# Patient Record
Sex: Male | Born: 1980 | Race: White | Hispanic: No | Marital: Single | Smoking: Current some day smoker
Health system: Southern US, Community
[De-identification: ages and names within clinical notes are randomized; demographics above are authoritative.]

---

## 2013-10-05 ENCOUNTER — Ambulatory Visit (HOSPITAL_COMMUNITY)
Admission: RE | Admit: 2013-10-05 | Discharge: 2013-10-05 | Disposition: A | Discharge status: Home / Self Care | Payer: PRIVATE HEALTH INSURANCE | Source: Ambulatory Visit | Attending: Emergency Medicine | Admitting: Emergency Medicine

## 2013-10-05 ENCOUNTER — Other Ambulatory Visit: Payer: Self-pay | Admitting: Emergency Medicine

## 2013-10-05 ENCOUNTER — Ambulatory Visit (INDEPENDENT_AMBULATORY_CARE_PROVIDER_SITE_OTHER): Payer: PRIVATE HEALTH INSURANCE | Admitting: Emergency Medicine

## 2013-10-05 VITALS — BP 124/82 | HR 84 | Temp 98.0°F | Resp 16 | Ht 67.25 in | Wt 214.2 lb

## 2013-10-05 DIAGNOSIS — N509 Disorder of male genital organs, unspecified: Secondary | ICD-10-CM | POA: Diagnosis present

## 2013-10-05 DIAGNOSIS — N453 Epididymo-orchitis: Secondary | ICD-10-CM

## 2013-10-05 DIAGNOSIS — N50811 Right testicular pain: Secondary | ICD-10-CM

## 2013-10-05 DIAGNOSIS — N452 Orchitis: Secondary | ICD-10-CM

## 2013-10-05 DIAGNOSIS — R350 Frequency of micturition: Secondary | ICD-10-CM

## 2013-10-05 LAB — POCT URINALYSIS DIPSTICK
Bilirubin, UA: NEGATIVE
Glucose, UA: NEGATIVE
Ketones, UA: NEGATIVE
Leukocytes, UA: NEGATIVE
NITRITE UA: NEGATIVE
PH UA: 6
Protein, UA: 30
RBC UA: NEGATIVE
Spec Grav, UA: 1.015
UROBILINOGEN UA: 0.2

## 2013-10-05 LAB — HIV ANTIBODY (ROUTINE TESTING W REFLEX): HIV 1&2 Ab, 4th Generation: NONREACTIVE

## 2013-10-05 LAB — POCT CBC
GRANULOCYTE PERCENT: 70.5 % (ref 37–80)
HEMATOCRIT: 47.3 % (ref 43.5–53.7)
Hemoglobin: 15.4 g/dL (ref 14.1–18.1)
Lymph, poc: 1.6 (ref 0.6–3.4)
MCH, POC: 31.4 pg — AB (ref 27–31.2)
MCHC: 32.6 g/dL (ref 31.8–35.4)
MCV: 96.4 fL (ref 80–97)
MID (cbc): 0.5 (ref 0–0.9)
MPV: 8.3 fL (ref 0–99.8)
POC GRANULOCYTE: 4.9 (ref 2–6.9)
POC LYMPH PERCENT: 22.3 %L (ref 10–50)
POC MID %: 7.2 %M (ref 0–12)
Platelet Count, POC: 163 10*3/uL (ref 142–424)
RBC: 4.9 M/uL (ref 4.69–6.13)
RDW, POC: 12.1 %
WBC: 7 10*3/uL (ref 4.6–10.2)

## 2013-10-05 LAB — GLUCOSE, POCT (MANUAL RESULT ENTRY): POC GLUCOSE: 87 mg/dL (ref 70–99)

## 2013-10-05 LAB — RPR

## 2013-10-05 MED ORDER — DOXYCYCLINE HYCLATE 100 MG PO TABS: 100.0000 mg | ORAL_TABLET | Freq: Two times a day (BID) | ORAL | Status: DC

## 2013-10-05 NOTE — Addendum Note (Signed)
Addended by: Ihor Dow on: 10/05/2013 03:28 PM   Modules accepted: Orders

## 2013-10-05 NOTE — Patient Instructions (Signed)
Go to [facility] register at radiology for U/S

## 2013-10-05 NOTE — Progress Notes (Signed)
Subjective:    Patient ID: Dustin Andersen, male    DOB: [birth year], 33 y.o.   MRN: [id number]  This chart was scribed for Lesle Chris, MD by Jarvis Morgan, Medical Scribe. This patient was seen in Room 5 and the patient's care was started at 11:35 AM.  Chief Complaint  Patient presents with  . Testicle Pain    x2 day, swelling especially on the right side. Worsening   . Night Sweats    soaked sheets when he woke up this morning  . Nausea  . Fever    felt feverish last night      HPI HPI Comments: Dustin Andersen is a 33 y.o. male who presents to the [facility] complaining of constant, moderate, gradually worsening right testicular pain for 2 days. He states he woke up two days and it felt like he had been kicked in the area. He admits that when he got up to pee he noted that his right testicle was more tender. He states he immediately took an aspirin and hot bath to see if it would help with the pain. Pt initially thought the pain could be due to exercise. He reports that at about 3:00 yesterday the pain began to gradually increase. He noticed increase frequency, subjective fever, night sweats, swelling to his right testicle, and nausea.  He states that the pain is exacerbated by sitting. Pt has taken Ibuprofen and admits that has provided initial relief. He denies any swelling or tenderness to his left testicle. He also denies any dysuria or penile discharge.    Review of Systems  Constitutional: Positive for fever (subjective), chills and diaphoresis.  Gastrointestinal: Positive for nausea.  Genitourinary: Positive for frequency and testicular pain (right sided pain and swelling). Negative for dysuria and discharge.       Objective:   Physical Exam CONSTITUTIONAL: Well developed/well nourished HEAD: Normocephalic/atraumatic EYES: EOMI/PERRL ENMT: Mucous membranes moist NECK: supple no meningeal signs SPINE:entire spine nontender CV: S1/S2 noted, no  murmurs/rubs/gallops noted LUNGS: Lungs are clear to auscultation bilaterally, no apparent distress ABDOMEN: soft, nontender, no rebound or guarding GU:no cva tenderness Mild tenderness of right testicle and mild tenderness of epididymis of right testicle. Left testicle is normal. No discharge. NEURO: Pt is awake/alert, moves all extremitiesx4 EXTREMITIES: pulses normal, full ROM SKIN: warm, color normal PSYCH: no abnormalities of mood noted Results for orders placed in visit on 10/05/13  POCT CBC      Result Value Ref Range   WBC 7.0  4.6 - 10.2 K/uL   Lymph, poc 1.6  0.6 - 3.4   POC LYMPH PERCENT 22.3  10 - 50 %L   MID (cbc) 0.5  0 - 0.9   POC MID % 7.2  0 - 12 %M   POC Granulocyte 4.9  2 - 6.9   Granulocyte percent 70.5  37 - 80 %G   RBC 4.90  4.69 - 6.13 M/uL   Hemoglobin 15.4  14.1 - 18.1 g/dL   HCT, POC 40.9  81.1 - 53.7 %   MCV 96.4  80 - 97 fL   MCH, POC 31.4 (*) 27 - 31.2 pg   MCHC 32.6  31.8 - 35.4 g/dL   RDW, POC 91.4     Platelet Count, POC 163  142 - 424 K/uL   MPV 8.3  0 - 99.8 fL  GLUCOSE, POCT (MANUAL RESULT ENTRY)      Result Value Ref Range   POC Glucose 87  70 - 99 mg/dl       Assessment & Plan:  Patient going to [facility] for a Doppler of his testicle. Will treat patient with ibuprofen high dose along with doxycycline twice a day. Cultures were done .

## 2013-10-09 ENCOUNTER — Telehealth: Payer: Self-pay | Admitting: Emergency Medicine

## 2013-10-09 LAB — GC/CHLAMYDIA PROBE AMP
CT PROBE, AMP APTIMA: NEGATIVE
GC PROBE AMP APTIMA: NEGATIVE

## 2013-10-09 LAB — MUMPS ANTIBODY, IGG

## 2013-10-09 NOTE — Telephone Encounter (Signed)
Call check on status. Be sure patient is improving on medication

## 2013-10-09 NOTE — Telephone Encounter (Signed)
Lm for pt to rtn call to provide [place] with an update.

## 2013-10-10 LAB — MUMPS ANTIBODY, IGM: Mumps IgM Value: <1:20 {titer}

## 2015-07-09 IMAGING — US US ART/VEN ABD/PELV/SCROTUM DOPPLER LTD
1 series · 13 of 25 positions shown · non-contrast
Comparison: None.

CLINICAL DATA: Right-sided testicular pain. Evaluate for testicular
torsion.

EXAM:
ULTRASOUND OF SCROTUM
TECHNIQUE: Complete ultrasound examination of the testicles, epididymis, and
other scrotal structures was performed.

[Series 1: us art/ven abd/pelv/scrotum doppler ltd · 0.07mm/px · 13 of 79 slices shown]
[im 1/79]
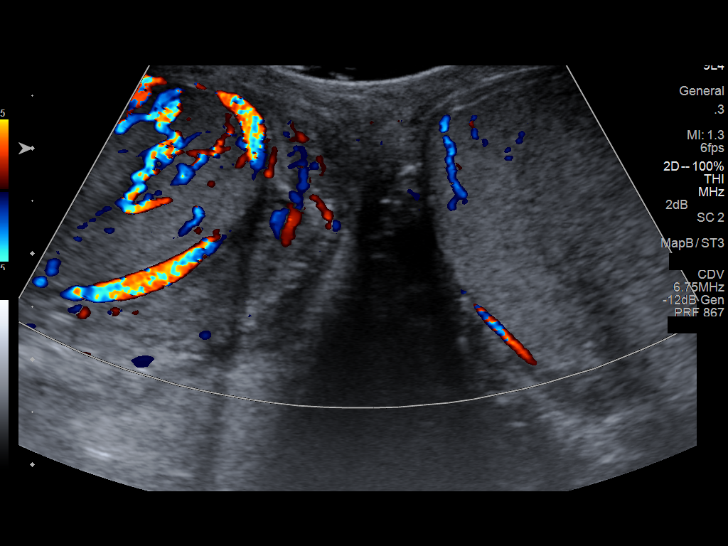
[im 7/79]
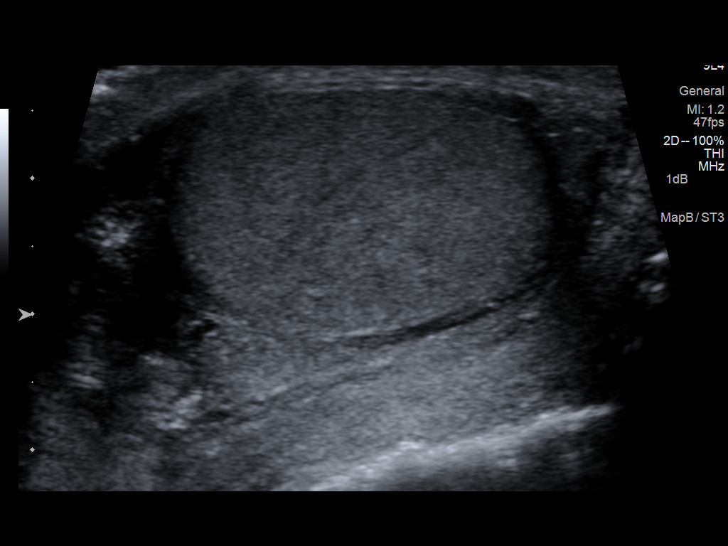
[im 14/79]
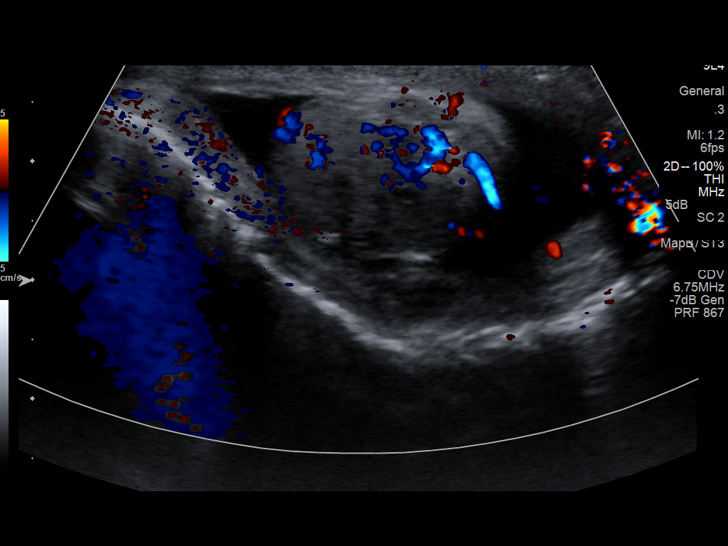
[im 20/79]
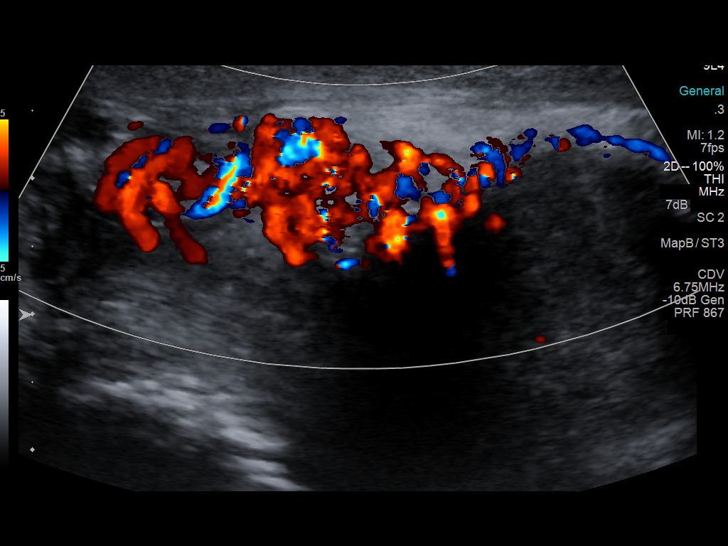
[im 27/79]
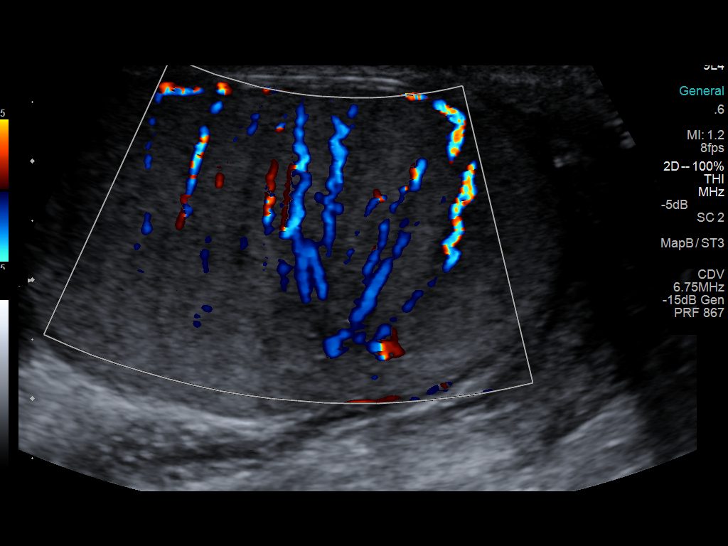
[im 33/79]
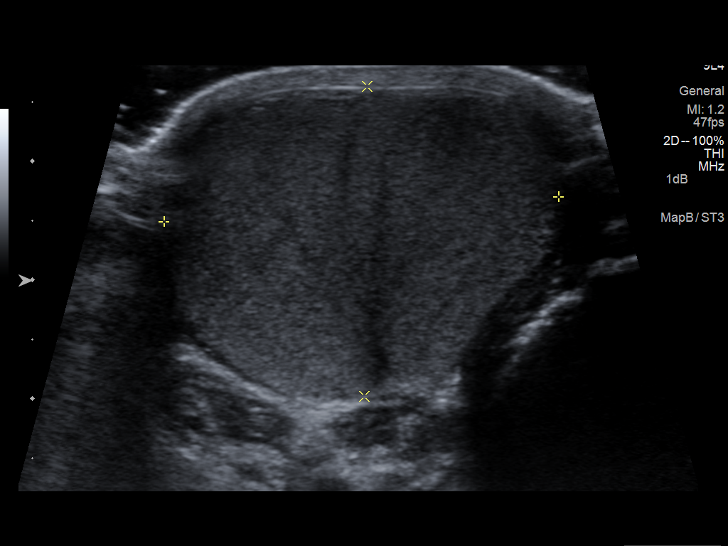
[im 40/79]
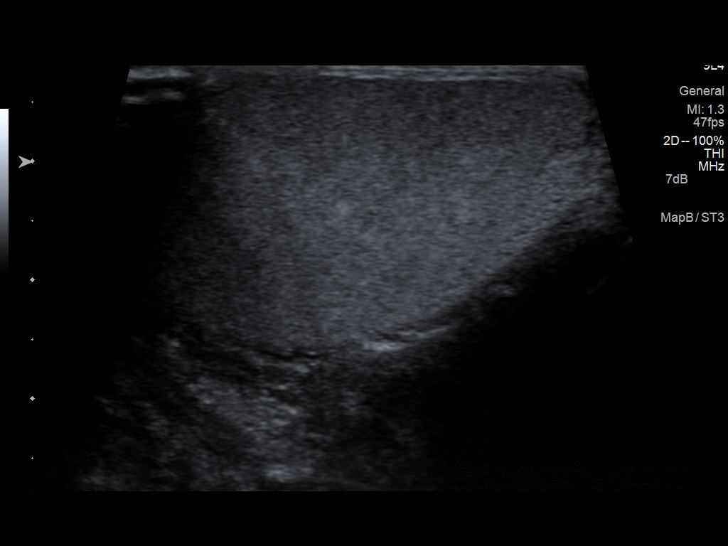
[im 46/79]
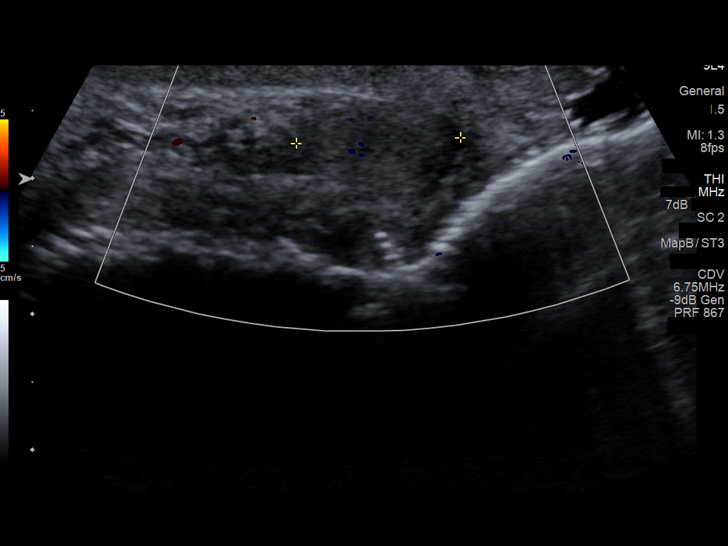
[im 53/79]
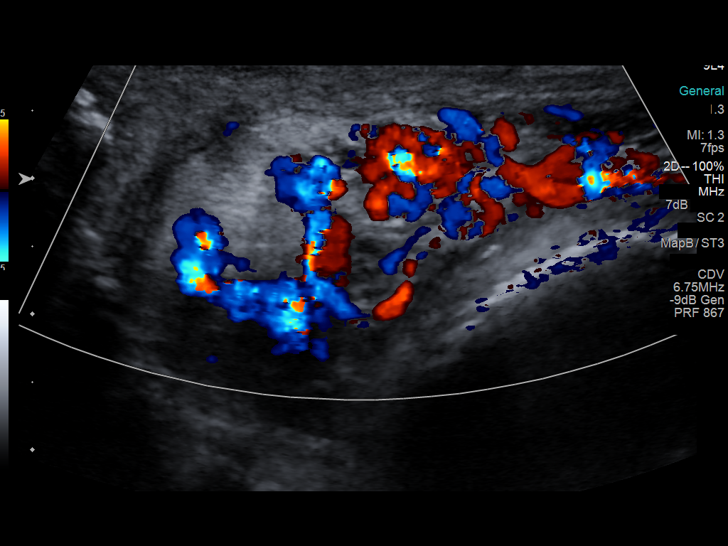
[im 59/79]
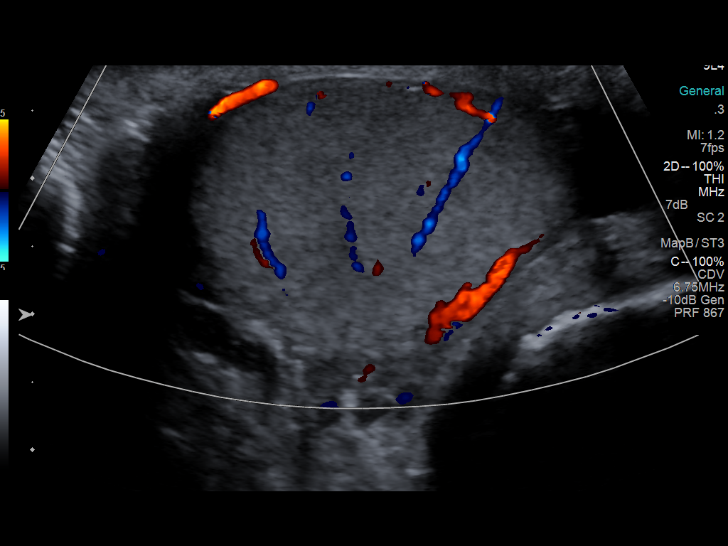
[im 66/79]
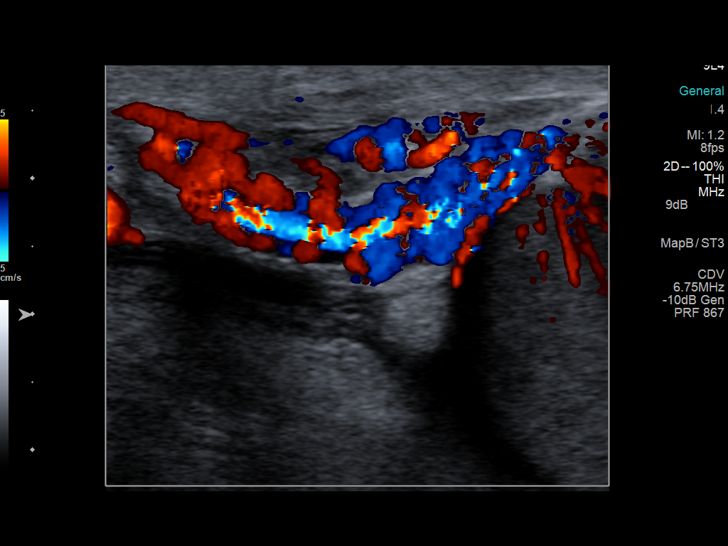
[im 72/79]
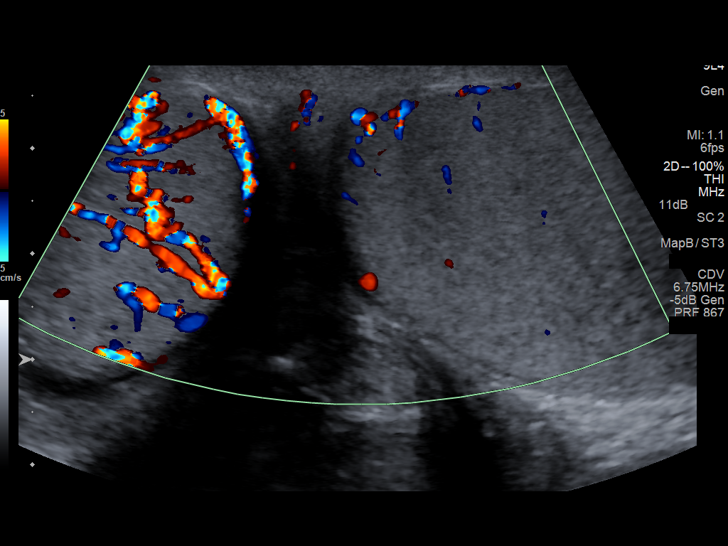
[im 79/79]
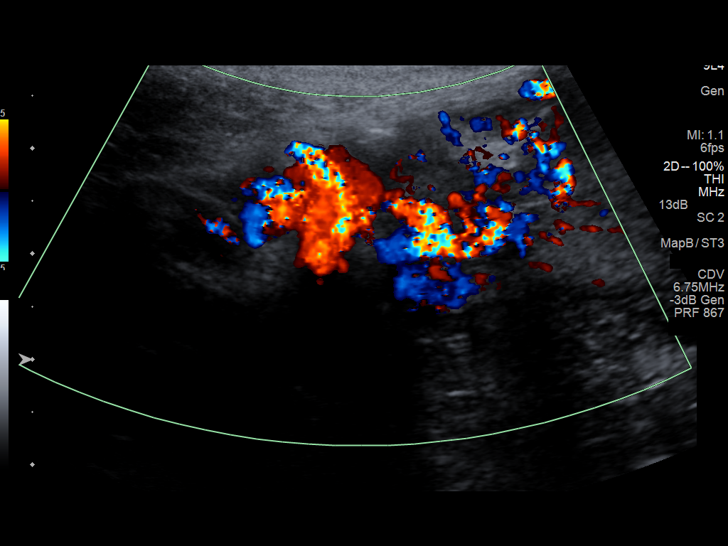

[13 of 25 positions shown; findings below may reference images not displayed]

FINDINGS: Right testicle

Measurements: Normal in size measuring 3.3 x 2.1 x 4.4 cm. There is
asymmetric hyperemia of the right testis (images 2 - 4). There is
very mild diffuse heterogeneity of the right testis parenchyma. No
discrete intra or extratesticular mass. Normal low resistance
arterial and venous waveforms are demonstrated within the right
testis.

Left testicle

Measurements: Normal in size measuring 3.3 x 2.6 x 4.7 cm. There is
relative homogeneity of the left testis parenchyma. No discrete
intra or S extratesticular mass. Normal low resistance arterial and
venous waveforms are demonstrated within the left testis.

Right epididymis:  Normal in size and appearance

Left epididymis:  Normal in size and appearance

Hydrocele:  Note is made of a small right-sided hydrocele.

Varicocele: Bilateral varicoceles are seen (right - image 22; left -
image 79).
IMPRESSION: 1. Asymmetric hyperemia of the right testis worrisome for orchitis.
No evidence of testicular torsion or intra or extratesticular mass.
2. Trace right-sided hydrocele, presumably reactive.
3. Small bilateral varicoceles

## 2021-09-10 DIAGNOSIS — I10 Essential (primary) hypertension: Secondary | ICD-10-CM | POA: Diagnosis not present

## 2021-09-10 DIAGNOSIS — Z13220 Encounter for screening for lipoid disorders: Secondary | ICD-10-CM | POA: Diagnosis not present

## 2021-09-10 DIAGNOSIS — Z130 Encounter for screening for diseases of the blood and blood-forming organs and certain disorders involving the immune mechanism: Secondary | ICD-10-CM | POA: Diagnosis not present

## 2023-04-24 ENCOUNTER — Ambulatory Visit: Payer: PRIVATE HEALTH INSURANCE | Admitting: Family Medicine

## 2023-04-24 ENCOUNTER — Encounter: Payer: Self-pay | Admitting: Family Medicine

## 2023-04-24 VITALS — BP 139/101 | HR 109 | Ht 67.0 in | Wt 237.0 lb

## 2023-04-24 DIAGNOSIS — F17200 Nicotine dependence, unspecified, uncomplicated: Secondary | ICD-10-CM | POA: Insufficient documentation

## 2023-04-24 DIAGNOSIS — Z114 Encounter for screening for human immunodeficiency virus [HIV]: Secondary | ICD-10-CM | POA: Diagnosis not present

## 2023-04-24 DIAGNOSIS — Z716 Tobacco abuse counseling: Secondary | ICD-10-CM | POA: Diagnosis not present

## 2023-04-24 DIAGNOSIS — E6609 Other obesity due to excess calories: Secondary | ICD-10-CM

## 2023-04-24 DIAGNOSIS — E669 Obesity, unspecified: Secondary | ICD-10-CM | POA: Diagnosis not present

## 2023-04-24 DIAGNOSIS — Z13220 Encounter for screening for lipoid disorders: Secondary | ICD-10-CM | POA: Diagnosis not present

## 2023-04-24 DIAGNOSIS — Z0000 Encounter for general adult medical examination without abnormal findings: Secondary | ICD-10-CM

## 2023-04-24 DIAGNOSIS — R21 Rash and other nonspecific skin eruption: Secondary | ICD-10-CM

## 2023-04-24 DIAGNOSIS — R944 Abnormal results of kidney function studies: Secondary | ICD-10-CM

## 2023-04-24 DIAGNOSIS — Z1159 Encounter for screening for other viral diseases: Secondary | ICD-10-CM

## 2023-04-24 DIAGNOSIS — Z0001 Encounter for general adult medical examination with abnormal findings: Secondary | ICD-10-CM

## 2023-04-24 DIAGNOSIS — E66812 Obesity, class 2: Secondary | ICD-10-CM

## 2023-04-24 DIAGNOSIS — Z130 Encounter for screening for diseases of the blood and blood-forming organs and certain disorders involving the immune mechanism: Secondary | ICD-10-CM | POA: Diagnosis not present

## 2023-04-24 DIAGNOSIS — Z13228 Encounter for screening for other metabolic disorders: Secondary | ICD-10-CM | POA: Diagnosis not present

## 2023-04-24 DIAGNOSIS — E66811 Obesity, class 1: Secondary | ICD-10-CM | POA: Insufficient documentation

## 2023-04-24 DIAGNOSIS — Z6837 Body mass index (BMI) 37.0-37.9, adult: Secondary | ICD-10-CM

## 2023-04-24 DIAGNOSIS — Z1329 Encounter for screening for other suspected endocrine disorder: Secondary | ICD-10-CM | POA: Diagnosis not present

## 2023-04-24 MED ORDER — DESONIDE 0.05 % EX CREA: TOPICAL_CREAM | Freq: Two times a day (BID) | CUTANEOUS | 0 refills | Status: DC

## 2023-04-24 NOTE — Progress Notes (Signed)
 New patient; Complete physical exam   Patient: Dustin Andersen   DOB: [birth year]   43 y.o. Male  MRN: [id number] Visit Date: 04/24/2023  Today's healthcare provider: Sherlyn Hay, DO   Chief Complaint  Patient presents with   Establish Care    No concerns   Subjective    Dustin Andersen is a 43 y.o. male who presents today for a complete physical exam.  He reports consuming a general diet.  He had not been exercising consistently but restarted about a week ago.  He generally feels well. He reports sleeping well. He does not have additional problems to discuss today.  HPI HPI     Establish Care    Additional comments: No concerns      Last edited by Thedora Hinders, CMA on 04/24/2023  8:58 AM.      Dustin Andersen is a 43 year old male who presents for a routine physical exam.  He has not had a formal check-up since 2011 and feels nervous about the visit but recognizes the importance of a health evaluation.  He has a history of smoking, currently averaging about five cigarettes a day, primarily at work. He has reduced his smoking from a previous habit of a pack a day during college. He uses stop smoking lozenges and Tic Tacs to help reduce smoking. Smoking is more of a work habit, and he smokes less on weekends due to family activities.  He has recently resumed physical activity, having stopped exercising regularly since his daughter's birth. He started going back to the gym last Sunday and can currently run half a mile before needing to walk. He aims to increase his physical activity gradually. He feels sore from working out but attributes it to exercise.  He is concerned about his weight, noting that he has gained weight since he stopped working out regularly. His diet includes eating more than normal, sometimes waking up to eat, and consuming burgers for breakfast. He wants to make healthier choices, influenced by his seven-year-old daughter's nutritionist's  recommendations. No increased thirst or urination.  He uses hydrocortisone cream for a rash that developed after wearing old boxers with worn-out elastic. The rash is located on his left lateral waistline, and the cream has been helping. He also uses the cream for occasional itching in other areas.  His stepfather is currently battling leukemia, which has been a significant stressor for him. His stepfather is undergoing treatment at [id number] and is part of a clinical trial. He has been involved in supporting his stepfather, including watching his dog during treatments.   History reviewed. No pertinent past medical history. History reviewed. No pertinent surgical history. Social History   Socioeconomic History   Marital status: Single    Spouse name: Not on file   Number of children: Not on file   Years of education: Not on file   Highest education level: Not on file  Occupational History   Not on file  Tobacco Use   Smoking status: Some Days   Smokeless tobacco: Not on file   Tobacco comments:    Only smokes about 1/4 pack a day  Substance and Sexual Activity   Alcohol use: Yes   Drug use: No   Sexual activity: Yes  Other Topics Concern   Not on file  Social History Narrative   Not on file   Social Drivers of Health   Financial Resource Strain: Low Risk  (04/24/2023)   Overall Financial Resource  Strain (CARDIA)    Difficulty of Paying Living Expenses: Not hard at all  Food Insecurity: No Food Insecurity (04/24/2023)   Hunger Vital Sign    Worried About Running Out of Food in the Last Year: Never true    Ran Out of Food in the Last Year: Never true  Transportation Needs: No Transportation Needs (04/24/2023)   PRAPARE - Administrator, Civil Service (Medical): No    Lack of Transportation (Non-Medical): No  Physical Activity: Not on file  Stress: No Stress Concern Present (04/24/2023)   [employer] of Occupational Health - Occupational Stress Questionnaire     Feeling of Stress : Not at all  Social Connections: Not on file  Intimate Partner Violence: Not At Risk (04/24/2023)   Humiliation, Afraid, Rape, and Kick questionnaire    Fear of Current or Ex-Partner: No    Emotionally Abused: No    Physically Abused: No    Sexually Abused: No   Family Status  Relation Name Status   Mother  Alive   Father  Alive   Mat Aunt  (Not Specified)   PGM  (Not Specified)  No partnership data on file   Family History  Problem Relation Age of Onset   Cancer Mother        Breast CA   Aneurysm Maternal Aunt    Cancer Paternal Grandmother        Breast CA   No Known Allergies  Patient Care Team: Travonna Swindle N, DO as PCP - General (Family Medicine)   Medications: Outpatient Medications Prior to Visit  Medication Sig   doxycycline (VIBRA-TABS) 100 MG tablet Take 1 tablet (100 mg total) by mouth 2 (two) times daily. (Patient not taking: Reported on 04/24/2023)   No facility-administered medications prior to visit.    Review of Systems  Constitutional:  Negative for appetite change, chills, fatigue and fever.  HENT:  Negative for congestion, ear pain, hearing loss, nosebleeds and trouble swallowing.   Eyes:  Negative for pain and visual disturbance.  Respiratory:  Negative for cough, chest tightness and shortness of breath.   Cardiovascular:  Negative for chest pain, palpitations and leg swelling.  Gastrointestinal:  Negative for abdominal pain, blood in stool, constipation, diarrhea, nausea and vomiting.  Endocrine: Negative for polydipsia, polyphagia and polyuria.  Genitourinary:  Negative for dysuria and flank pain.  Musculoskeletal:  Negative for arthralgias, back pain, joint swelling, myalgias and neck stiffness.  Skin:  Negative for color change, rash and wound.  Neurological:  Negative for dizziness, tremors, seizures, speech difficulty, weakness, light-headedness and headaches.  Psychiatric/Behavioral:  Negative for behavioral problems,  confusion, decreased concentration, dysphoric mood and sleep disturbance. The patient is not nervous/anxious.   All other systems reviewed and are negative.     Objective    BP (!) 139/101   Pulse (!) 109   Ht 5\' 7"  (1.702 m)   Wt 237 lb (107.5 kg)   SpO2 98%   BMI 37.12 kg/m    Physical Exam Vitals and nursing note reviewed.  Constitutional:      General: He is awake.     Appearance: Normal appearance.  HENT:     Head: Normocephalic and atraumatic.     Right Ear: Tympanic membrane, ear canal and external ear normal.     Left Ear: Tympanic membrane, ear canal and external ear normal.     Nose: Nose normal.     Mouth/Throat:     Mouth: Mucous membranes are moist.  Pharynx: Oropharynx is clear. No oropharyngeal exudate or posterior oropharyngeal erythema.  Eyes:     General: No scleral icterus.    Extraocular Movements: Extraocular movements intact.     Conjunctiva/sclera: Conjunctivae normal.     Pupils: Pupils are equal, round, and reactive to light.  Neck:     Thyroid: No thyromegaly or thyroid tenderness.  Cardiovascular:     Rate and Rhythm: Normal rate and regular rhythm.     Pulses: Normal pulses.     Heart sounds: Normal heart sounds.  Pulmonary:     Effort: Pulmonary effort is normal. No tachypnea, bradypnea or respiratory distress.     Breath sounds: Normal breath sounds. No stridor. No wheezing, rhonchi or rales.  Abdominal:     General: Bowel sounds are normal. There is no distension.     Palpations: Abdomen is soft. There is no mass.     Tenderness: There is no abdominal tenderness. There is no guarding.     Hernia: No hernia is present.  Musculoskeletal:     Cervical back: Normal range of motion and neck supple.     Right lower leg: No edema.     Left lower leg: No edema.  Lymphadenopathy:     Cervical: No cervical adenopathy.  Skin:    General: Skin is warm and dry.     Findings: Rash present.       Neurological:     Mental Status: He is  alert and oriented to person, place, and time. Mental status is at baseline.  Psychiatric:        Mood and Affect: Mood normal.        Behavior: Behavior normal.      Last depression screening scores    04/24/2023    9:02 AM  PHQ 2/9 Scores  PHQ - 2 Score 0   Last fall risk screening    04/24/2023    9:15 AM  Fall Risk   Falls in the past year? 0  Number falls in past yr: 0  Injury with Fall? 0  Risk for fall due to : No Fall Risks   Last Audit-C alcohol use screening    04/24/2023    9:01 AM  Alcohol Use Disorder Test (AUDIT)  1. How often do you have a drink containing alcohol? 1  2. How many drinks containing alcohol do you have on a typical day when you are drinking? 0  3. How often do you have six or more drinks on one occasion? 0  AUDIT-C Score 1   A score of 3 or more in women, and 4 or more in men indicates increased risk for alcohol abuse, EXCEPT if all of the points are from question 1   No results found for any visits on 04/24/23.  Assessment & Plan    Routine Health Maintenance and Physical Exam  Exercise Activities and Dietary recommendations  Goals   None     Immunization History  Administered Date(s) Administered   Janssen (J&J) SARS-COV-2 Vaccination 04/05/2019    Health Maintenance  Topic Date Due   Hepatitis C Screening  Never done   INFLUENZA VACCINE  05/01/2023 (Originally 09/01/2022)   COVID-19 Vaccine (2 - 2024-25 season) 05/10/2023 (Originally 10/02/2022)   DTaP/Tdap/Td (1 - Tdap) 05/28/2023 (Originally 10/08/1999)   Pneumococcal Vaccine 85-48 Years old (1 of 2 - PCV) 04/23/2024 (Originally 10/08/1986)   HIV Screening  Completed   HPV VACCINES  Aged Out    Discussed health benefits of physical  activity, and encouraged him to engage in regular exercise appropriate for his age and condition.   Encounter for medical examination to establish care Assessment & Plan: Physical exam overall unremarkable except as noted above. Routine lab work  ordered as noted. Does not want Tdap vaccine today but will consider at next visit; he is afraid of shots and wants to prepare mentally for it. No physical exam since 2011. Lacks screening for HIV, hepatitis C, and vaccinations. Agreed to fasting blood work for cholesterol and diabetes screening. - Screen for HIV and hepatitis C. - Order fasting blood work for cholesterol and diabetes screening. - Discuss vaccinations, including tetanus, COVID booster, flu shot, and pneumonia vaccine, in future visits.    Nicotine dependence with current use Assessment & Plan: Addressed as noted below.    Encounter for smoking cessation counseling Assessment & Plan: Has been cutting back using lozenges. Smoking about 5 cigarettes per day at work. - Encourage reduction to two cigarettes per day. - Suggest non-vape alternatives to occupy hands and mouth. - Continue using stop smoking lozenges.   Decreased glomerular filtration rate (GFR) Assessment & Plan: eGFR 78 09/10/2021 (in Care Everywhere). Recheck today.   Screening for endocrine, metabolic and immunity disorder -     Comprehensive metabolic panel -     Hemoglobin A1c  Screening for lipid disorders Assessment & Plan: Checking cholesterol today. Patient has started making changes to diet and exercise. Will plan to recheck in 6 months if elevated today.  Orders: -     Lipid panel  Encounter for hepatitis C screening test for low risk patient -     HCV Ab w Reflex to Quant PCR  Encounter for screening for HIV -     HIV Antibody (routine testing w rflx)  Obesity (BMI 35.0-39.9 without comorbidity) -     Hemoglobin A1c  Rash -     Desonide; Apply topically 2 (two) times daily.  Dispense: 30 g; Refill: 0  Class 2 obesity due to excess calories without serious comorbidity with body mass index (BMI) of 37.0 to 37.9 in adult Assessment & Plan: Weight gain due to lack of exercise. Resumed running and walking. Improving diet by reducing  portions and choosing healthier options. - Encourage continuation of exercise routine with gradual increase in activity. - Advise on dietary modifications, including reducing portion sizes and choosing healthier food options.   Rash Groin rash likely from irritation. Hydrocortisone provides some relief. Prescribed stronger topical cream. - Prescribe a stronger topical cream for the rash. - Advise to use the cream sparingly and continue with hydrocortisone if needed. - Suggest trying a lotion type cream if itching occurs.   Return in about 6 months (around 10/25/2023) for Weight.     I discussed the assessment and treatment plan with the patient  The patient was provided an opportunity to ask questions and all were answered. The patient agreed with the plan and demonstrated an understanding of the instructions.   The patient was advised to call back or seek an in-person evaluation if the symptoms worsen or if the condition fails to improve as anticipated.    Sherlyn Hay, DO  [facility] [phone] (phone) [phone] (fax)  [facility]

## 2023-04-24 NOTE — Assessment & Plan Note (Signed)
 Weight gain due to lack of exercise. Resumed running and walking. Improving diet by reducing portions and choosing healthier options. - Encourage continuation of exercise routine with gradual increase in activity. - Advise on dietary modifications, including reducing portion sizes and choosing healthier food options.

## 2023-04-24 NOTE — Assessment & Plan Note (Addendum)
 Has been cutting back using lozenges. Smoking about 5 cigarettes per day at work. - Encourage reduction to two cigarettes per day. - Suggest non-vape alternatives to occupy hands and mouth. - Continue using stop smoking lozenges.

## 2023-04-24 NOTE — Assessment & Plan Note (Addendum)
 eGFR 78 09/10/2021 (in Care Everywhere). Recheck today.

## 2023-04-24 NOTE — Assessment & Plan Note (Signed)
 Checking cholesterol today. Patient has started making changes to diet and exercise. Will plan to recheck in 6 months if elevated today.

## 2023-04-24 NOTE — Assessment & Plan Note (Signed)
 Addressed as noted below.

## 2023-04-24 NOTE — Assessment & Plan Note (Addendum)
 Physical exam overall unremarkable except as noted above. Routine lab work ordered as noted. Does not want Tdap vaccine today but will consider at next visit; he is afraid of shots and wants to prepare mentally for it. No physical exam since 2011. Lacks screening for HIV, hepatitis C, and vaccinations. Agreed to fasting blood work for cholesterol and diabetes screening. - Screen for HIV and hepatitis C. - Order fasting blood work for cholesterol and diabetes screening. - Discuss vaccinations, including tetanus, COVID booster, flu shot, and pneumonia vaccine, in future visits.

## 2023-04-25 LAB — COMPREHENSIVE METABOLIC PANEL
ALT: 31 IU/L (ref 0–44)
AST: 22 IU/L (ref 0–40)
Albumin: 4.4 g/dL (ref 4.1–5.1)
Alkaline Phosphatase: 48 IU/L (ref 44–121)
BUN/Creatinine Ratio: 22 — ABNORMAL HIGH (ref 9–20)
BUN: 20 mg/dL (ref 6–24)
Bilirubin Total: <0.2 mg/dL (ref 0.0–1.2)
CO2: 21 mmol/L (ref 20–29)
Calcium: 9.8 mg/dL (ref 8.7–10.2)
Chloride: 100 mmol/L (ref 96–106)
Creatinine, Ser: 0.93 mg/dL (ref 0.76–1.27)
Globulin, Total: 2.2 g/dL (ref 1.5–4.5)
Glucose: 201 mg/dL — ABNORMAL HIGH (ref 70–99)
Potassium: 4.5 mmol/L (ref 3.5–5.2)
Sodium: 137 mmol/L (ref 134–144)
Total Protein: 6.6 g/dL (ref 6.0–8.5)
eGFR: 105 mL/min/{1.73_m2} (ref >59)

## 2023-04-25 LAB — LIPID PANEL
Chol/HDL Ratio: 4.1 ratio (ref 0.0–5.0)
Cholesterol, Total: 195 mg/dL (ref 100–199)
HDL: 47 mg/dL (ref >39)
LDL Chol Calc (NIH): 104 mg/dL — ABNORMAL HIGH (ref 0–99)
Triglycerides: 255 mg/dL — ABNORMAL HIGH (ref 0–149)
VLDL Cholesterol Cal: 44 mg/dL — ABNORMAL HIGH (ref 5–40)

## 2023-04-25 LAB — HEMOGLOBIN A1C
Est. average glucose Bld gHb Est-mCnc: 177 mg/dL
Hgb A1c MFr Bld: 7.8 % — ABNORMAL HIGH (ref 4.8–5.6)

## 2023-04-25 LAB — HCV AB W REFLEX TO QUANT PCR: HCV Ab: NONREACTIVE

## 2023-04-25 LAB — HIV ANTIBODY (ROUTINE TESTING W REFLEX): HIV Screen 4th Generation wRfx: NONREACTIVE

## 2023-04-25 LAB — HCV INTERPRETATION

## 2023-05-08 ENCOUNTER — Other Ambulatory Visit: Payer: Self-pay | Admitting: Family Medicine

## 2023-05-08 ENCOUNTER — Encounter: Payer: Self-pay | Admitting: Family Medicine

## 2023-05-08 ENCOUNTER — Telehealth: Payer: Self-pay

## 2023-05-08 DIAGNOSIS — E119 Type 2 diabetes mellitus without complications: Secondary | ICD-10-CM

## 2023-05-08 DIAGNOSIS — R7309 Other abnormal glucose: Secondary | ICD-10-CM

## 2023-05-08 MED ORDER — BLOOD GLUCOSE TEST VI STRP: 1.0000 | ORAL_STRIP | Freq: Every day | 3 refills | Status: AC

## 2023-05-08 MED ORDER — BLOOD GLUCOSE MONITORING SUPPL DEVI: 1.0000 | Freq: Every day | 0 refills | Status: AC

## 2023-05-08 MED ORDER — LANCET DEVICE MISC: 1.0000 | Freq: Every day | 0 refills | Status: AC

## 2023-05-08 MED ORDER — LANCETS MISC. MISC: 1.0000 | Freq: Every day | 3 refills | Status: AC

## 2023-05-08 NOTE — Telephone Encounter (Signed)
 Copied from CRM [id number]. Topic: Clinical - Lab/Test Results >> May 08, 2023  8:38 AM Alessandra Bevels wrote: Reason for CRM: Patient is calling to report that he did not eat right prior to lab work. He ate 5 burgers that week with chocolate milk. Patient has returned to the gym. And patient would like to work on his diet and exercise if it is ok with Dr. Payton Mccallum. And return back in September for fasting labs. Please advise

## 2023-05-11 ENCOUNTER — Encounter: Payer: Self-pay | Admitting: Family Medicine

## 2023-10-23 ENCOUNTER — Encounter: Payer: Self-pay | Admitting: Family Medicine

## 2023-10-23 ENCOUNTER — Ambulatory Visit: Admitting: Family Medicine

## 2023-10-23 VITALS — BP 142/100 | HR 108 | Temp 98.7°F | Ht 67.0 in | Wt 223.2 lb

## 2023-10-23 DIAGNOSIS — E7800 Pure hypercholesterolemia, unspecified: Secondary | ICD-10-CM

## 2023-10-23 DIAGNOSIS — R7309 Other abnormal glucose: Secondary | ICD-10-CM | POA: Diagnosis not present

## 2023-10-23 DIAGNOSIS — Z7689 Persons encountering health services in other specified circumstances: Secondary | ICD-10-CM

## 2023-10-23 DIAGNOSIS — E66811 Obesity, class 1: Secondary | ICD-10-CM | POA: Diagnosis not present

## 2023-10-23 DIAGNOSIS — K529 Noninfective gastroenteritis and colitis, unspecified: Secondary | ICD-10-CM

## 2023-10-23 DIAGNOSIS — R030 Elevated blood-pressure reading, without diagnosis of hypertension: Secondary | ICD-10-CM

## 2023-10-23 DIAGNOSIS — R21 Rash and other nonspecific skin eruption: Secondary | ICD-10-CM

## 2023-10-23 DIAGNOSIS — F17200 Nicotine dependence, unspecified, uncomplicated: Secondary | ICD-10-CM

## 2023-10-23 DIAGNOSIS — Z716 Tobacco abuse counseling: Secondary | ICD-10-CM

## 2023-10-23 MED ORDER — DESONIDE 0.05 % EX CREA: TOPICAL_CREAM | Freq: Two times a day (BID) | CUTANEOUS | 0 refills | Status: AC

## 2023-10-23 NOTE — Patient Instructions (Addendum)
Check your blood pressure twice weekly, and any time you have concerning symptoms like headache, chest pain, dizziness, shortness of breath, or vision changes.   Our goal is less than 130/80.  To appropriately check your blood pressure, make sure you do the following:  1) Avoid caffeine, exercise, or tobacco products for 30 minutes before checking. Empty your bladder. 2) Sit with your back supported in a flat-backed chair. Rest your arm on something flat (arm of the chair, table, etc). 3) Sit still with your feet flat on the floor, resting, for at least 5 minutes.  4) Check your blood pressure. Take 1-2 readings.  5) Write down these readings and bring with you to any provider appointments.  Bring your home blood pressure machine with you to a provider's office for accuracy comparison at least once a year.   Make sure you take your blood pressure medications before you come to any office visit, even if you were asked to fast for labs.

## 2023-10-23 NOTE — Progress Notes (Signed)
 Established patient visit   Patient: Dustin Andersen   DOB: [birth year]   43 y.o. Male  MRN: [id number] Visit Date: 10/23/2023  Today's healthcare provider: LAURAINE LOISE BUOY, DO   Chief Complaint  Patient presents with   Weight Check    Patient reports he is here for his weight check.  Reports that he is trying to lose weight, started going to the gym and working out.  Also changing his diet, no longer eating in the middle of the night.  Protein shake in the morning, eating lunch and dinner.  Exercising 5 days a week and states that he feels fine.  All vaccines declined.   Subjective    HPI Dustin Andersen is a 43 year old male who presents for a weight check follow-up.  He has been actively trying to lose weight by exercising five days a week, using the elliptical and incline walking. He recently adjusted his workout to not hold onto the machine during incline walking, which he finds more challenging. Despite these efforts, he feels he has not lost as much weight as expected, partly due to dietary indulgences on weekends, often related to family gatherings and celebrations.  He has however lost 14 pounds.  He has made significant dietary changes, including having a protein shake for breakfast, Lean Cuisine or Healthy Choice meals for lunch, and a varied dinner. He has stopped eating in the middle of the night and has reduced his intake of fast food, previously consuming up to six burgers a day.  He reports additional stress due to his stepfather's health issues, who is battling leukemia and has been on multiple clinical trials. His stepfather's condition has deteriorated, requiring frequent medical attention and hospital visits, which he has been involved in. This situation has added emotional and logistical stress to his life.  Recently, he experienced a stomach bug, likely contracted from his daughter, which led to the use of anti-diarrheal medication. He also takes aspirin daily,  though he is unsure if this is necessary. He has been monitoring his blood pressure at home, noting some variability in readings.  He has significantly reduced his smoking, now only occasionally smoking, and uses nicotine lozenges to help manage cravings. Smoking is more of a work habit, and he is working on reducing it further.  He denies chest pain or shortness of breath. He monitors his heart rate during exercise, noting it varies between 130-147 bpm.       Medications: Outpatient Medications Prior to Visit  Medication Sig   aspirin EC 81 MG tablet Take 81 mg by mouth daily. Swallow whole.   Blood Glucose Monitoring Suppl DEVI 1 each by Does not apply route daily before breakfast. May substitute to any manufacturer covered by patient's insurance.   Glucose Blood (BLOOD GLUCOSE TEST STRIPS) STRP 1 each by In Vitro route daily before breakfast. May substitute to any manufacturer covered by patient's insurance.   Lancet Device MISC 1 each by Does not apply route daily before breakfast. May substitute to any manufacturer covered by patient's insurance.   [DISCONTINUED] desonide  (DESOWEN ) 0.05 % cream Apply topically 2 (two) times daily.   [DISCONTINUED] doxycycline  (VIBRA -TABS) 100 MG tablet Take 1 tablet (100 mg total) by mouth 2 (two) times daily. (Patient not taking: Reported on 04/24/2023)   No facility-administered medications prior to visit.    Review of Systems  Respiratory: Negative.  Negative for cough, shortness of breath and wheezing.   Cardiovascular:  Negative for chest  pain, palpitations and leg swelling.  Gastrointestinal:  Negative for diarrhea (recent episode; now resolved).  Neurological:  Negative for weakness and headaches.        Objective    BP (!) 142/100 (BP Location: Left Arm, Patient Position: Sitting, Cuff Size: Large)   Pulse (!) 108   Temp 98.7 F (37.1 C) (Oral)   Ht 5' 7 (1.702 m)   Wt 223 lb 3.2 oz (101.2 kg)   SpO2 100%   BMI 34.96 kg/m      Physical Exam Vitals and nursing note reviewed.  Constitutional:      General: He is not in acute distress.    Appearance: Normal appearance. He is not diaphoretic.  HENT:     Head: Normocephalic and atraumatic.  Eyes:     General: No scleral icterus.    Conjunctiva/sclera: Conjunctivae normal.  Cardiovascular:     Rate and Rhythm: Normal rate and regular rhythm.     Pulses: Normal pulses.     Heart sounds: Normal heart sounds. No murmur heard. Pulmonary:     Effort: Pulmonary effort is normal. No respiratory distress.     Breath sounds: Normal breath sounds. No wheezing or rhonchi.  Musculoskeletal:     Cervical back: Neck supple.     Right lower leg: No edema.     Left lower leg: No edema.  Lymphadenopathy:     Cervical: No cervical adenopathy.  Skin:    General: Skin is warm and dry.     Findings: No rash.  Neurological:     Mental Status: He is alert and oriented to person, place, and time. Mental status is at baseline.  Psychiatric:        Mood and Affect: Mood normal.        Behavior: Behavior normal.      No results found for any visits on 10/23/23.  Assessment & Plan    Obesity (BMI 30.0-34.9) -     Comprehensive metabolic panel with GFR  Encounter for weight management  Elevated hemoglobin A1c -     Hemoglobin A1c  Elevated blood pressure reading in office without diagnosis of hypertension  Nicotine dependence with current use  Encounter for smoking cessation counseling  Rash -     Desonide ; Apply topically 2 (two) times daily.  Dispense: 30 g; Refill: 0  Elevated LDL cholesterol level -     Lipid panel  Acute gastroenteritis       Obesity (BMI 30.0-34.9); encounter for weight management; elevated hemoglobin A1c 14-pound weight loss through increased activity and dietary changes. Previous high A1c indicates prediabetes. Considering keto diet for management. - Order blood work to re-evaluate A1c levels. - Encourage continuation of  current exercise regimen and dietary changes. - Discuss potential use of metformin if A1c remains high. - Schedule follow-up in three months to reassess A1c and weight management. - Encourage portion control and mindful eating, especially during family gatherings. - Consider a healthy version of a keto diet if A1c remains high.  Elevated blood pressure reading in office without diagnosis of hypertension Variable readings with some improvement. Aspirin not recommended for primary prevention. Smoking and stress may contribute.  Patient prefers to continue lifestyle modifications rather than start medication at this time. - Discontinue daily aspirin use for primary prevention. - Encourage continued monitoring of blood pressure at home and maintain a log. - Bring blood pressure cuff to next appointment for validation. - Encourage reduction of stress and smoking cessation. - Continue lifestyle modifications  of diet and exercise and reassess on follow-up.  Nicotine dependence with current use; smoking cessation counseling Significant reduction in smoking with nicotine lozenges.  Discussed that smoking contributes to elevated blood pressure and heart rate. - Continue using nicotine lozenges to aid in smoking cessation. - Encourage further reduction in smoking, especially in work settings.  Acute gastroenteritis, resolved Recent onset likely contracted from daughter, affecting fasting ability. - Reschedule blood work for next week to allow for fasting blood work.    Return in about 3 months (around 01/22/2024) for Chronic f/u.      I discussed the assessment and treatment plan with the patient  The patient was provided an opportunity to ask questions and all were answered. The patient agreed with the plan and demonstrated an understanding of the instructions.   The patient was advised to call back or seek an in-person evaluation if the symptoms worsen or if the condition fails to improve as  anticipated.    LAURAINE LOISE BUOY, DO  [facility] [phone] (phone) [phone] (fax)  [facility]

## 2024-01-29 ENCOUNTER — Ambulatory Visit: Admitting: Family Medicine

## 2024-02-19 ENCOUNTER — Ambulatory Visit: Admitting: Family Medicine

## 2024-04-01 ENCOUNTER — Ambulatory Visit: Admitting: Family Medicine
